# Patient Record
Sex: Female | Born: 1990 | Race: Black or African American | Hispanic: Yes | Marital: Single | Smoking: Current every day smoker
Health system: Southern US, Community
[De-identification: ages and names within clinical notes are randomized; demographics above are authoritative.]

## PROBLEM LIST (undated history)

## (undated) ENCOUNTER — Inpatient Hospital Stay (HOSPITAL_COMMUNITY): Payer: Self-pay

## (undated) DIAGNOSIS — J45909 Unspecified asthma, uncomplicated: Secondary | ICD-10-CM

## (undated) DIAGNOSIS — A599 Trichomoniasis, unspecified: Secondary | ICD-10-CM

## (undated) DIAGNOSIS — S060XAA Concussion with loss of consciousness status unknown, initial encounter: Secondary | ICD-10-CM

## (undated) DIAGNOSIS — R51 Headache: Secondary | ICD-10-CM

## (undated) DIAGNOSIS — M25559 Pain in unspecified hip: Secondary | ICD-10-CM

## (undated) DIAGNOSIS — S060X9A Concussion with loss of consciousness of unspecified duration, initial encounter: Secondary | ICD-10-CM

## (undated) DIAGNOSIS — Q431 Hirschsprung's disease: Secondary | ICD-10-CM

## (undated) DIAGNOSIS — K589 Irritable bowel syndrome without diarrhea: Secondary | ICD-10-CM

## (undated) DIAGNOSIS — F329 Major depressive disorder, single episode, unspecified: Secondary | ICD-10-CM

## (undated) DIAGNOSIS — F419 Anxiety disorder, unspecified: Secondary | ICD-10-CM

## (undated) DIAGNOSIS — Q059 Spina bifida, unspecified: Secondary | ICD-10-CM

## (undated) DIAGNOSIS — G8929 Other chronic pain: Secondary | ICD-10-CM

## (undated) DIAGNOSIS — M549 Dorsalgia, unspecified: Secondary | ICD-10-CM

## (undated) DIAGNOSIS — F32A Depression, unspecified: Secondary | ICD-10-CM

## (undated) HISTORY — PX: MOUTH SURGERY: SHX715

## (undated) HISTORY — PX: TYMPANOSTOMY TUBE PLACEMENT: SHX32

## (undated) HISTORY — PX: BACK SURGERY: SHX140

## (undated) HISTORY — DX: Concussion with loss of consciousness status unknown, initial encounter: S06.0XAA

## (undated) HISTORY — DX: Concussion with loss of consciousness of unspecified duration, initial encounter: S06.0X9A

---
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
## 2010-01-24 ENCOUNTER — Emergency Department (HOSPITAL_COMMUNITY): Admission: EM | Admit: 2010-01-24 | Discharge: 2010-01-25 | Payer: Self-pay | Admitting: Emergency Medicine

## 2010-06-05 ENCOUNTER — Emergency Department (HOSPITAL_COMMUNITY): Admission: EM | Admit: 2010-06-05 | Discharge: 2010-06-06 | Payer: Self-pay | Admitting: Emergency Medicine

## 2010-09-11 ENCOUNTER — Emergency Department (HOSPITAL_COMMUNITY): Admission: EM | Admit: 2010-09-11 | Discharge: 2010-09-11 | Payer: Self-pay | Admitting: Emergency Medicine

## 2010-09-22 ENCOUNTER — Emergency Department (HOSPITAL_COMMUNITY): Admission: EM | Admit: 2010-09-22 | Discharge: 2010-09-22 | Payer: Self-pay | Admitting: Emergency Medicine

## 2011-03-02 ENCOUNTER — Emergency Department (HOSPITAL_COMMUNITY)
Admission: EM | Admit: 2011-03-02 | Discharge: 2011-03-02 | Disposition: A | Discharge status: Home / Self Care | Payer: Self-pay | Attending: Emergency Medicine | Admitting: Emergency Medicine

## 2011-03-02 DIAGNOSIS — Q059 Spina bifida, unspecified: Secondary | ICD-10-CM | POA: Insufficient documentation

## 2011-03-02 DIAGNOSIS — Q431 Hirschsprung's disease: Secondary | ICD-10-CM | POA: Insufficient documentation

## 2011-03-02 DIAGNOSIS — K089 Disorder of teeth and supporting structures, unspecified: Secondary | ICD-10-CM | POA: Insufficient documentation

## 2011-03-02 DIAGNOSIS — Q432 Other congenital functional disorders of colon: Secondary | ICD-10-CM | POA: Insufficient documentation

## 2011-03-02 DIAGNOSIS — K029 Dental caries, unspecified: Secondary | ICD-10-CM | POA: Insufficient documentation

## 2011-03-02 DIAGNOSIS — R070 Pain in throat: Secondary | ICD-10-CM | POA: Insufficient documentation

## 2011-03-12 LAB — RAPID URINE DRUG SCREEN, HOSP PERFORMED
Amphetamines: NOT DETECTED
Benzodiazepines: POSITIVE — AB
Cocaine: NOT DETECTED
Opiates: POSITIVE — AB
Tetrahydrocannabinol: POSITIVE — AB

## 2011-03-12 LAB — URINALYSIS, ROUTINE W REFLEX MICROSCOPIC
Bilirubin Urine: NEGATIVE
Glucose, UA: NEGATIVE mg/dL
Glucose, UA: NEGATIVE mg/dL
Hgb urine dipstick: NEGATIVE
Ketones, ur: NEGATIVE mg/dL
Nitrite: NEGATIVE
Protein, ur: NEGATIVE mg/dL
Protein, ur: NEGATIVE mg/dL
Urobilinogen, UA: 0.2 mg/dL (ref 0.0–1.0)
pH: 5.5 (ref 5.0–8.0)

## 2011-03-12 LAB — DIFFERENTIAL
Lymphocytes Relative: 22 % (ref 12–46)
Lymphs Abs: 1.8 10*3/uL (ref 0.7–4.0)
Monocytes Absolute: 0.6 10*3/uL (ref 0.1–1.0)
Monocytes Relative: 7 % (ref 3–12)
Neutro Abs: 5.8 10*3/uL (ref 1.7–7.7)
Neutrophils Relative %: 69 % (ref 43–77)

## 2011-03-12 LAB — URINE MICROSCOPIC-ADD ON

## 2011-03-12 LAB — CBC
HCT: 38.5 % (ref 36.0–46.0)
Hemoglobin: 13.2 g/dL (ref 12.0–15.0)
MCHC: 34.3 g/dL (ref 30.0–36.0)
RBC: 4.23 MIL/uL (ref 3.87–5.11)

## 2011-03-12 LAB — RAPID STREP SCREEN (MED CTR MEBANE ONLY): Streptococcus, Group A Screen (Direct): NEGATIVE

## 2011-03-12 LAB — BASIC METABOLIC PANEL
GFR calc Af Amer: >60 mL/min (ref >60)
GFR calc non Af Amer: >60 mL/min (ref >60)
Glucose, Bld: 90 mg/dL (ref 70–99)
Potassium: 4 mEq/L (ref 3.5–5.1)
Sodium: 141 mEq/L (ref 135–145)

## 2011-03-12 LAB — TRICYCLICS SCREEN, URINE: TCA Scrn: NOT DETECTED

## 2011-03-12 LAB — POCT PREGNANCY, URINE: Preg Test, Ur: NEGATIVE

## 2011-04-01 LAB — CBC
Hemoglobin: 11.4 g/dL — ABNORMAL LOW (ref 12.0–16.0)
MCHC: 33.5 g/dL (ref 31.0–37.0)
Platelets: 190 10*3/uL (ref 150–400)
RBC: 3.24 MIL/uL — ABNORMAL LOW (ref 3.80–5.70)
RBC: 3.68 MIL/uL — ABNORMAL LOW (ref 3.80–5.70)
RDW: 14.6 % (ref 11.4–15.5)
WBC: 17.7 10*3/uL — ABNORMAL HIGH (ref 4.5–13.5)

## 2011-04-01 LAB — RPR: RPR Ser Ql: NONREACTIVE

## 2011-05-01 ENCOUNTER — Emergency Department (HOSPITAL_COMMUNITY)
Admission: EM | Admit: 2011-05-01 | Discharge: 2011-05-01 | Discharge status: Against Medical Advice | Payer: Self-pay | Attending: Emergency Medicine | Admitting: Emergency Medicine

## 2011-05-01 DIAGNOSIS — M25559 Pain in unspecified hip: Secondary | ICD-10-CM | POA: Insufficient documentation

## 2011-05-01 DIAGNOSIS — R51 Headache: Secondary | ICD-10-CM | POA: Insufficient documentation

## 2011-09-22 LAB — POCT URINALYSIS DIP (DEVICE)
Bilirubin Urine: NEGATIVE
Glucose, UA: NEGATIVE
Nitrite: NEGATIVE
Operator id: 247071
Urobilinogen, UA: 0.2

## 2011-09-22 LAB — RAPID URINE DRUG SCREEN, HOSP PERFORMED
Barbiturates: NOT DETECTED
Cocaine: NOT DETECTED
Opiates: NOT DETECTED
Tetrahydrocannabinol: POSITIVE — AB

## 2011-09-22 LAB — POCT PREGNANCY, URINE: Preg Test, Ur: NEGATIVE

## 2012-11-10 ENCOUNTER — Emergency Department (HOSPITAL_COMMUNITY)
Admission: EM | Admit: 2012-11-10 | Discharge: 2012-11-10 | Disposition: A | Discharge status: Home / Self Care | Payer: Self-pay | Attending: Emergency Medicine | Admitting: Emergency Medicine

## 2012-11-10 ENCOUNTER — Emergency Department (HOSPITAL_COMMUNITY): Payer: Self-pay

## 2012-11-10 ENCOUNTER — Encounter (HOSPITAL_COMMUNITY): Payer: Self-pay | Admitting: *Deleted

## 2012-11-10 DIAGNOSIS — F17200 Nicotine dependence, unspecified, uncomplicated: Secondary | ICD-10-CM | POA: Insufficient documentation

## 2012-11-10 DIAGNOSIS — Y9389 Activity, other specified: Secondary | ICD-10-CM | POA: Insufficient documentation

## 2012-11-10 DIAGNOSIS — IMO0002 Reserved for concepts with insufficient information to code with codable children: Secondary | ICD-10-CM | POA: Insufficient documentation

## 2012-11-10 DIAGNOSIS — S8990XA Unspecified injury of unspecified lower leg, initial encounter: Secondary | ICD-10-CM | POA: Insufficient documentation

## 2012-11-10 DIAGNOSIS — S99929A Unspecified injury of unspecified foot, initial encounter: Secondary | ICD-10-CM | POA: Insufficient documentation

## 2012-11-10 DIAGNOSIS — J45909 Unspecified asthma, uncomplicated: Secondary | ICD-10-CM | POA: Insufficient documentation

## 2012-11-10 DIAGNOSIS — M25562 Pain in left knee: Secondary | ICD-10-CM

## 2012-11-10 DIAGNOSIS — Y9229 Other specified public building as the place of occurrence of the external cause: Secondary | ICD-10-CM | POA: Insufficient documentation

## 2012-11-10 HISTORY — DX: Unspecified asthma, uncomplicated: J45.909

## 2012-11-10 MED ORDER — TRAMADOL HCL 50 MG PO TABS: 50.0000 mg | ORAL_TABLET | Freq: Four times a day (QID) | ORAL | Status: DC | PRN

## 2012-11-10 NOTE — ED Notes (Signed)
Patient given discharge instructions, information, prescriptions, and diet order. Patient states that they adequately understand discharge information given and to return to ED if symptoms return or worsen.     
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
## 2012-11-15 NOTE — ED Provider Notes (Signed)
Medical screening examination/treatment/procedure(s) were performed by non-physician practitioner and as supervising physician I was immediately available for consultation/collaboration.   Fishel Wamble M Birdie Fetty, MD 11/15/12 0819 

## 2012-12-28 NOTE — L&D Delivery Note (Signed)
Delivery Note At 1:57 PM a viable female was delivered via  (Presentation: vtx, ROA ).  APGAR: 9, 9; weight pending.   Placenta status: spontaneous, intact .  Cord:  with the following complications: none  Anesthesia:  Epidural Episiotomy: None Lacerations: None Suture Repair: none Est. Blood Loss (mL):  400  Mom to postpartum.  Baby to Couplet care / Skin to Skin, stable.  Andrea Marshall D 12/14/2013, 2:13 PM

## 2013-04-26 ENCOUNTER — Emergency Department (HOSPITAL_COMMUNITY): Payer: Self-pay

## 2013-04-26 ENCOUNTER — Encounter (HOSPITAL_COMMUNITY): Payer: Self-pay | Admitting: *Deleted

## 2013-04-26 ENCOUNTER — Emergency Department (HOSPITAL_COMMUNITY)
Admission: EM | Admit: 2013-04-26 | Discharge: 2013-04-26 | Disposition: A | Discharge status: Home / Self Care | Payer: Self-pay | Attending: Emergency Medicine | Admitting: Emergency Medicine

## 2013-04-26 DIAGNOSIS — R1084 Generalized abdominal pain: Secondary | ICD-10-CM | POA: Insufficient documentation

## 2013-04-26 DIAGNOSIS — R0602 Shortness of breath: Secondary | ICD-10-CM | POA: Insufficient documentation

## 2013-04-26 DIAGNOSIS — Z3201 Encounter for pregnancy test, result positive: Secondary | ICD-10-CM | POA: Insufficient documentation

## 2013-04-26 DIAGNOSIS — R112 Nausea with vomiting, unspecified: Secondary | ICD-10-CM | POA: Insufficient documentation

## 2013-04-26 DIAGNOSIS — R062 Wheezing: Secondary | ICD-10-CM | POA: Insufficient documentation

## 2013-04-26 DIAGNOSIS — J45909 Unspecified asthma, uncomplicated: Secondary | ICD-10-CM | POA: Insufficient documentation

## 2013-04-26 DIAGNOSIS — Z331 Pregnant state, incidental: Secondary | ICD-10-CM

## 2013-04-26 DIAGNOSIS — N949 Unspecified condition associated with female genital organs and menstrual cycle: Secondary | ICD-10-CM | POA: Insufficient documentation

## 2013-04-26 DIAGNOSIS — R1110 Vomiting, unspecified: Secondary | ICD-10-CM

## 2013-04-26 DIAGNOSIS — R197 Diarrhea, unspecified: Secondary | ICD-10-CM

## 2013-04-26 LAB — CBC WITH DIFFERENTIAL/PLATELET
Basophils Relative: 1 % (ref 0–1)
Eosinophils Absolute: 0.1 10*3/uL (ref 0.0–0.7)
Eosinophils Relative: 1 % (ref 0–5)
Hemoglobin: 12.2 g/dL (ref 12.0–15.0)
Lymphs Abs: 3.1 10*3/uL (ref 0.7–4.0)
MCH: 30.9 pg (ref 26.0–34.0)
MCHC: 34.9 g/dL (ref 30.0–36.0)
MCV: 88.6 fL (ref 78.0–100.0)
Monocytes Relative: 7 % (ref 3–12)
Neutrophils Relative %: 61 % (ref 43–77)
Platelets: 265 10*3/uL (ref 150–400)
RBC: 3.95 MIL/uL (ref 3.87–5.11)

## 2013-04-26 LAB — WET PREP, GENITAL
Trich, Wet Prep: NONE SEEN
Yeast Wet Prep HPF POC: NONE SEEN

## 2013-04-26 LAB — URINE MICROSCOPIC-ADD ON

## 2013-04-26 LAB — URINALYSIS, ROUTINE W REFLEX MICROSCOPIC
Glucose, UA: NEGATIVE mg/dL
Nitrite: NEGATIVE
Specific Gravity, Urine: 1.019 (ref 1.005–1.030)
pH: 7 (ref 5.0–8.0)

## 2013-04-26 LAB — COMPREHENSIVE METABOLIC PANEL
Albumin: 3.8 g/dL (ref 3.5–5.2)
BUN: 5 mg/dL — ABNORMAL LOW (ref 6–23)
Calcium: 9.2 mg/dL (ref 8.4–10.5)
GFR calc Af Amer: >90 mL/min (ref >90)
Glucose, Bld: 91 mg/dL (ref 70–99)
Potassium: 4.2 mEq/L (ref 3.5–5.1)
Total Protein: 7.4 g/dL (ref 6.0–8.3)

## 2013-04-26 LAB — POCT PREGNANCY, URINE: Preg Test, Ur: POSITIVE — AB

## 2013-04-26 LAB — LIPASE, BLOOD: Lipase: 23 U/L (ref 11–59)

## 2013-04-26 MED ORDER — ONDANSETRON HCL 4 MG/2ML IJ SOLN
4.0000 mg | Freq: Once | INTRAMUSCULAR | Status: AC
  Administered 2013-04-26: 4 mg via INTRAVENOUS
  Filled 2013-04-26: qty 2

## 2013-04-26 MED ORDER — ONDANSETRON HCL 4 MG PO TABS: 4.0000 mg | ORAL_TABLET | Freq: Four times a day (QID) | ORAL | Status: DC

## 2013-04-26 MED ORDER — SODIUM CHLORIDE 0.9 % IV BOLUS (SEPSIS)
1000.0000 mL | Freq: Once | INTRAVENOUS | Status: AC
  Administered 2013-04-26: 1000 mL via INTRAVENOUS

## 2013-04-26 NOTE — ED Notes (Signed)
Multiple complaints. Reports having asthma, sob and is out of her inhaler. Having back pain, headache and pelvic pain.

## 2013-04-26 NOTE — ED Provider Notes (Signed)
History     CSN: [id number]  Arrival date & time 04/26/13  1501   First MD Initiated Contact with Patient 04/26/13 1815      Chief Complaint  Patient presents with  . Headache  . Asthma    (Consider location/radiation/quality/duration/timing/severity/associated sxs/prior treatment) HPI Comments: This presents to the ER for evaluation of headache, increased wheezing secondary to asthma, nausea, vomiting and diarrhea. Patient has been experiencing intermittent mid abdominal cramping. She has not had a fever. Nausea and vomiting has been recurrent over the course of the day. She's having trouble eating and drinking. Pain is intermittent and crampy in the abdomen. She has a moderate constant throbbing headache.  Patient reports that her period has been irregular since she started birth control. She had some slight spotting in the last week or 2, but has not had a regular period in a couple of months.  Patient is a 22 y.o. female presenting with headaches and asthma.  Headache Associated symptoms: abdominal pain, diarrhea, nausea and vomiting   Asthma Associated symptoms include abdominal pain, headaches and shortness of breath.    Past Medical History  Diagnosis Date  . Asthma     History reviewed. No pertinent past surgical history.  History reviewed. No pertinent family history.  History  Substance Use Topics  . Smoking status: Current Every Day Smoker  . Smokeless tobacco: Not on file  . Alcohol Use: No    OB History   Grav Para Term Preterm Abortions TAB SAB Ect Mult Living                  Review of Systems  Respiratory: Positive for shortness of breath and wheezing.   Gastrointestinal: Positive for nausea, vomiting, abdominal pain and diarrhea.  Genitourinary: Positive for pelvic pain.  Neurological: Positive for headaches.  All other systems reviewed and are negative.    Allergies  Aspirin; Penicillins; and Tylenol  Home Medications   Current  Outpatient Rx  Name  Route  Sig  Dispense  Refill  . DM-Doxylamine-Acetaminophen (NYQUIL COLD & FLU PO)   Oral   Take 2 capsules by mouth at bedtime as needed (FOR COLD).           BP 112/63  Pulse 79  Temp(Src) 98.4 F (36.9 C) (Oral)  Resp 16  SpO2 100%  LMP 01/28/2013  Physical Exam  Constitutional: She is oriented to person, place, and time. She appears well-developed and well-nourished. No distress.  HENT:  Head: Normocephalic and atraumatic.  Right Ear: Hearing normal.  Nose: Nose normal.  Mouth/Throat: Oropharynx is clear and moist and mucous membranes are normal.  Eyes: Conjunctivae and EOM are normal. Pupils are equal, round, and reactive to light.  Neck: Normal range of motion. Neck supple.  Cardiovascular: Normal rate, regular rhythm, S1 normal and S2 normal.  Exam reveals no gallop and no friction rub.   No murmur heard. Pulmonary/Chest: Effort normal and breath sounds normal. No respiratory distress. She exhibits no tenderness.  Abdominal: Soft. Normal appearance and bowel sounds are normal. There is no hepatosplenomegaly. There is generalized tenderness. There is no rebound, no guarding, no tenderness at McBurney's point and negative Murphy's sign. No hernia.  Genitourinary: Vagina normal and uterus normal. There is no tenderness on the right labia. There is no tenderness on the left labia. Cervix exhibits no motion tenderness and no discharge. Right adnexum displays no mass and no tenderness. Left adnexum displays no mass and no tenderness.  Musculoskeletal: Normal range of  motion.  Neurological: She is alert and oriented to person, place, and time. She has normal strength. No cranial nerve deficit or sensory deficit. Coordination normal. GCS eye subscore is 4. GCS verbal subscore is 5. GCS motor subscore is 6.  Skin: Skin is warm, dry and intact. No rash noted. No cyanosis.  Psychiatric: She has a normal mood and affect. Her speech is normal and behavior is normal.  Thought content normal.    ED Course  Procedures (including critical care time)  Labs Reviewed  URINALYSIS, ROUTINE W REFLEX MICROSCOPIC - Abnormal; Notable for the following:    APPearance CLOUDY (*)    Leukocytes, UA TRACE (*)    All other components within normal limits  URINE MICROSCOPIC-ADD ON - Abnormal; Notable for the following:    Squamous Epithelial / LPF MANY (*)    Bacteria, UA FEW (*)    All other components within normal limits  POCT PREGNANCY, URINE - Abnormal; Notable for the following:    Preg Test, Ur POSITIVE (*)    All other components within normal limits  CBC WITH DIFFERENTIAL  COMPREHENSIVE METABOLIC PANEL  LIPASE, BLOOD   US Ob Comp Less 14 Wks  04/26/2013  *RADIOLOGY REPORT*  Clinical Data: Pelvic pain.  Positive pregnancy test.  OBSTETRIC <14 WK [place] AND TRANSVAGINAL OB US  Technique:  Both transabdominal and transvaginal ultrasound examinations were performed for complete evaluation of the gestation as well as the maternal uterus, adnexal regions, and pelvic cul-de-sac.  Transvaginal technique was performed to assess early pregnancy.  Comparison:  None.  Intrauterine gestational sac:  Visualized Yolk sac: Visualized Embryo: Visualized Cardiac Activity: Visualized Heart Rate: 126  bpm  CRL: 6.8  mm  6 w  4 d        [place] EDC: 12/16/2013  Maternal uterus/adnexae: Moderate subchorionic hemorrhage is evident.  The maternal ovaries are unremarkable.  No intraperitoneal free fluid.  IMPRESSION: Single living intrauterine gestation at estimated 6-week-4-day distention only age by crown-rump length.  There is associated moderate subchorionic hemorrhage.   Original Report Authenticated By: Kennith Center, M.D.    US Ob Transvaginal  04/26/2013  *RADIOLOGY REPORT*  Clinical Data: Pelvic pain.  Positive pregnancy test.  OBSTETRIC <14 WK [place] AND TRANSVAGINAL OB US  Technique:  Both transabdominal and transvaginal ultrasound examinations were performed for complete evaluation of the  gestation as well as the maternal uterus, adnexal regions, and pelvic cul-de-sac.  Transvaginal technique was performed to assess early pregnancy.  Comparison:  None.  Intrauterine gestational sac:  Visualized Yolk sac: Visualized Embryo: Visualized Cardiac Activity: Visualized Heart Rate: 126  bpm  CRL: 6.8  mm  6 w  4 d        [place] EDC: 12/16/2013  Maternal uterus/adnexae: Moderate subchorionic hemorrhage is evident.  The maternal ovaries are unremarkable.  No intraperitoneal free fluid.  IMPRESSION: Single living intrauterine gestation at estimated 6-week-4-day distention only age by crown-rump length.  There is associated moderate subchorionic hemorrhage.   Original Report Authenticated By: Kennith Center, M.D.      Diagnosis: 1. Intrauterine pregnancy 2. Vomiting and diarrhea 3. History of Asthma    MDM  Patient comes to the ER for evaluation of multiple complaints. Patient has had nausea, vomiting and diarrhea. She was found to be pregnant upon initial urine pregnancy. This explains the majority of the patient's symptoms. She reports that she had some spotting earlier in the week. Pelvic exam was unremarkable. Ultrasound revealed intrauterine pregnancy.  Patient reports asthma, but  she has clear lungs without any bronchospasm currently. No evidence of ongoing asthma exacerbation at this time.  Patient was hydrated and is doing well. Her symptoms have resolved. She will be discharged with Zofran. She has an OB/GYN to followup with.        Gilda Crease, MD 04/27/13 1113
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
## 2013-04-27 LAB — GC/CHLAMYDIA PROBE AMP
CT Probe RNA: NEGATIVE
GC Probe RNA: NEGATIVE

## 2013-05-08 ENCOUNTER — Inpatient Hospital Stay (HOSPITAL_COMMUNITY)
Admission: AD | Admit: 2013-05-08 | Discharge: 2013-05-08 | Disposition: A | Discharge status: Home / Self Care | Payer: Medicaid Other | Source: Ambulatory Visit | Attending: Obstetrics and Gynecology | Admitting: Obstetrics and Gynecology

## 2013-05-08 ENCOUNTER — Encounter (HOSPITAL_COMMUNITY): Payer: Self-pay | Admitting: *Deleted

## 2013-05-08 DIAGNOSIS — O210 Mild hyperemesis gravidarum: Secondary | ICD-10-CM | POA: Insufficient documentation

## 2013-05-08 DIAGNOSIS — O219 Vomiting of pregnancy, unspecified: Secondary | ICD-10-CM

## 2013-05-08 HISTORY — DX: Depression, unspecified: F32.A

## 2013-05-08 HISTORY — DX: Pain in unspecified hip: M25.559

## 2013-05-08 HISTORY — DX: Headache: R51

## 2013-05-08 HISTORY — DX: Irritable bowel syndrome, unspecified: K58.9

## 2013-05-08 HISTORY — DX: Hirschsprung's disease: Q43.1

## 2013-05-08 HISTORY — DX: Major depressive disorder, single episode, unspecified: F32.9

## 2013-05-08 HISTORY — DX: Anxiety disorder, unspecified: F41.9

## 2013-05-08 HISTORY — DX: Trichomoniasis, unspecified: A59.9

## 2013-05-08 HISTORY — DX: Dorsalgia, unspecified: M54.9

## 2013-05-08 HISTORY — DX: Other chronic pain: G89.29

## 2013-05-08 HISTORY — DX: Spina bifida, unspecified: Q05.9

## 2013-05-08 LAB — URINALYSIS, ROUTINE W REFLEX MICROSCOPIC
Bilirubin Urine: NEGATIVE
Glucose, UA: NEGATIVE mg/dL
Hgb urine dipstick: NEGATIVE
Specific Gravity, Urine: 1.02 (ref 1.005–1.030)
Urobilinogen, UA: 0.2 mg/dL (ref 0.0–1.0)
pH: 7.5 (ref 5.0–8.0)

## 2013-05-08 MED ORDER — PROMETHAZINE HCL 25 MG PO TABS: 25.0000 mg | ORAL_TABLET | Freq: Four times a day (QID) | ORAL | Status: DC | PRN

## 2013-05-08 NOTE — MAU Note (Addendum)
Seen 4/30 @ [facility]. Was having some spotting.Had labs and cultures, U/S. States was not told results of U/S. States she has Zofran at home. Sometimes it works and sometimes not. States sometimes she vomits 1-2 times per day, sometimes not at all. States spotting had stopped, but noted some yesterday. Came in today because she is still getting sick, spotting and wants to make sure everything is OK. Plans to go to Dr. Jackelyn Knife where she went with last pregnancy. Was seen in office 4-5 months ago. Was getting Depo, but did not get the last scheduled one. Will change patient papers and armband to that group.
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
## 2013-05-08 NOTE — MAU Note (Signed)
Discussed results from 04/26/2013 with pt. Informed her of her due date and # weeks pregnant. Patient states she feels better about everything now. Encouragement given regarding nausea.

## 2013-05-10 NOTE — MAU Provider Note (Signed)
Attestation of Attending Supervision of Advanced Practitioner (PA/CNM/NP): Evaluation and management procedures were performed by the Advanced Practitioner under my supervision and collaboration.  I have reviewed the Advanced Practitioner's note and chart, and I agree with the management and plan.  Rivkah Wolz, MD, FACOG Attending Obstetrician & Gynecologist Faculty Practice, [facility]  

## 2013-06-27 LAB — OB RESULTS CONSOLE RPR: RPR: NONREACTIVE

## 2013-06-27 LAB — OB RESULTS CONSOLE ABO/RH

## 2013-06-27 LAB — OB RESULTS CONSOLE GC/CHLAMYDIA
Chlamydia: NEGATIVE
Gonorrhea: NEGATIVE

## 2013-06-27 LAB — OB RESULTS CONSOLE HIV ANTIBODY (ROUTINE TESTING): HIV: NONREACTIVE

## 2013-11-20 LAB — OB RESULTS CONSOLE GC/CHLAMYDIA
Chlamydia: NEGATIVE
Gonorrhea: NEGATIVE

## 2013-11-20 LAB — OB RESULTS CONSOLE GBS: GBS: NEGATIVE

## 2013-12-08 ENCOUNTER — Telehealth (HOSPITAL_COMMUNITY): Payer: Self-pay | Admitting: *Deleted

## 2013-12-08 ENCOUNTER — Encounter (HOSPITAL_COMMUNITY): Payer: Self-pay | Admitting: *Deleted

## 2013-12-08 NOTE — Telephone Encounter (Signed)
Preadmission screen  

## 2013-12-14 ENCOUNTER — Encounter (HOSPITAL_COMMUNITY): Payer: Medicaid Other | Admitting: Anesthesiology

## 2013-12-14 ENCOUNTER — Encounter (HOSPITAL_COMMUNITY): Payer: Self-pay

## 2013-12-14 ENCOUNTER — Inpatient Hospital Stay (HOSPITAL_COMMUNITY)
Admission: RE | Admit: 2013-12-14 | Discharge: 2013-12-16 | DRG: 775 | Disposition: A | Discharge status: Home / Self Care | Payer: Medicaid Other | Source: Ambulatory Visit | Attending: Obstetrics and Gynecology | Admitting: Obstetrics and Gynecology

## 2013-12-14 ENCOUNTER — Inpatient Hospital Stay (HOSPITAL_COMMUNITY): Payer: Medicaid Other | Admitting: Anesthesiology

## 2013-12-14 DIAGNOSIS — Z3490 Encounter for supervision of normal pregnancy, unspecified, unspecified trimester: Secondary | ICD-10-CM

## 2013-12-14 DIAGNOSIS — O99334 Smoking (tobacco) complicating childbirth: Principal | ICD-10-CM | POA: Diagnosis present

## 2013-12-14 LAB — CBC
MCH: 29.9 pg (ref 26.0–34.0)
MCHC: 33.3 g/dL (ref 30.0–36.0)
MCV: 89.6 fL (ref 78.0–100.0)
Platelets: 157 10*3/uL (ref 150–400)
RDW: 15.3 % (ref 11.5–15.5)

## 2013-12-14 LAB — RPR: RPR Ser Ql: NONREACTIVE

## 2013-12-14 MED ORDER — LACTATED RINGERS IV SOLN: 500.0000 mL | INTRAVENOUS | Status: DC | PRN

## 2013-12-14 MED ORDER — OXYTOCIN BOLUS FROM INFUSION: 500.0000 mL | INTRAVENOUS | Status: DC

## 2013-12-14 MED ORDER — SIMETHICONE 80 MG PO CHEW: 80.0000 mg | CHEWABLE_TABLET | ORAL | Status: DC | PRN

## 2013-12-14 MED ORDER — METHYLERGONOVINE MALEATE 0.2 MG PO TABS: 0.2000 mg | ORAL_TABLET | ORAL | Status: DC | PRN

## 2013-12-14 MED ORDER — LIDOCAINE HCL (PF) 1 % IJ SOLN
30.0000 mL | INTRAMUSCULAR | Status: DC | PRN
  Filled 2013-12-14: qty 30

## 2013-12-14 MED ORDER — TERBUTALINE SULFATE 1 MG/ML IJ SOLN: 0.2500 mg | Freq: Once | INTRAMUSCULAR | Status: DC | PRN

## 2013-12-14 MED ORDER — SENNOSIDES-DOCUSATE SODIUM 8.6-50 MG PO TABS
2.0000 | ORAL_TABLET | ORAL | Status: DC
  Administered 2013-12-15: 2 via ORAL
  Filled 2013-12-14: qty 2

## 2013-12-14 MED ORDER — DIPHENHYDRAMINE HCL 25 MG PO CAPS: 25.0000 mg | ORAL_CAPSULE | Freq: Four times a day (QID) | ORAL | Status: DC | PRN

## 2013-12-14 MED ORDER — WITCH HAZEL-GLYCERIN EX PADS: 1.0000 "application " | MEDICATED_PAD | CUTANEOUS | Status: DC | PRN

## 2013-12-14 MED ORDER — BENZOCAINE-MENTHOL 20-0.5 % EX AERO
1.0000 "application " | INHALATION_SPRAY | CUTANEOUS | Status: DC | PRN
  Administered 2013-12-14: 1 via TOPICAL
  Filled 2013-12-14: qty 56

## 2013-12-14 MED ORDER — OXYTOCIN 40 UNITS IN LACTATED RINGERS INFUSION - SIMPLE MED
1.0000 m[IU]/min | INTRAVENOUS | Status: DC
  Administered 2013-12-14: 2 m[IU]/min via INTRAVENOUS
  Administered 2013-12-14: 6 m[IU]/min via INTRAVENOUS
  Filled 2013-12-14: qty 1000

## 2013-12-14 MED ORDER — METHYLERGONOVINE MALEATE 0.2 MG/ML IJ SOLN: 0.2000 mg | INTRAMUSCULAR | Status: DC | PRN

## 2013-12-14 MED ORDER — LACTATED RINGERS IV SOLN: 500.0000 mL | Freq: Once | INTRAVENOUS | Status: DC

## 2013-12-14 MED ORDER — OXYCODONE-ACETAMINOPHEN 5-325 MG PO TABS: 1.0000 | ORAL_TABLET | ORAL | Status: DC | PRN

## 2013-12-14 MED ORDER — LACTATED RINGERS IV SOLN
INTRAVENOUS | Status: DC
  Administered 2013-12-14: 09:00:00 via INTRAVENOUS

## 2013-12-14 MED ORDER — ACETAMINOPHEN 325 MG PO TABS: 650.0000 mg | ORAL_TABLET | ORAL | Status: DC | PRN

## 2013-12-14 MED ORDER — SODIUM BICARBONATE 8.4 % IV SOLN
INTRAVENOUS | Status: DC | PRN
  Administered 2013-12-14: 5 mL via EPIDURAL

## 2013-12-14 MED ORDER — MAGNESIUM HYDROXIDE 400 MG/5ML PO SUSP: 30.0000 mL | ORAL | Status: DC | PRN

## 2013-12-14 MED ORDER — DIBUCAINE 1 % RE OINT: 1.0000 "application " | TOPICAL_OINTMENT | RECTAL | Status: DC | PRN

## 2013-12-14 MED ORDER — PHENYLEPHRINE 40 MCG/ML (10ML) SYRINGE FOR IV PUSH (FOR BLOOD PRESSURE SUPPORT)
80.0000 ug | PREFILLED_SYRINGE | INTRAVENOUS | Status: DC | PRN
  Filled 2013-12-14: qty 2

## 2013-12-14 MED ORDER — ONDANSETRON HCL 4 MG/2ML IJ SOLN: 4.0000 mg | Freq: Four times a day (QID) | INTRAMUSCULAR | Status: DC | PRN

## 2013-12-14 MED ORDER — ZOLPIDEM TARTRATE 5 MG PO TABS: 5.0000 mg | ORAL_TABLET | Freq: Every evening | ORAL | Status: DC | PRN

## 2013-12-14 MED ORDER — EPHEDRINE 5 MG/ML INJ
10.0000 mg | INTRAVENOUS | Status: DC | PRN
  Filled 2013-12-14: qty 2

## 2013-12-14 MED ORDER — EPHEDRINE 5 MG/ML INJ
10.0000 mg | INTRAVENOUS | Status: DC | PRN
  Filled 2013-12-14: qty 4
  Filled 2013-12-14: qty 2

## 2013-12-14 MED ORDER — LANOLIN HYDROUS EX OINT: TOPICAL_OINTMENT | CUTANEOUS | Status: DC | PRN

## 2013-12-14 MED ORDER — ALBUTEROL SULFATE HFA 108 (90 BASE) MCG/ACT IN AERS: 2.0000 | INHALATION_SPRAY | Freq: Four times a day (QID) | RESPIRATORY_TRACT | Status: DC | PRN

## 2013-12-14 MED ORDER — ONDANSETRON HCL 4 MG/2ML IJ SOLN: 4.0000 mg | INTRAMUSCULAR | Status: DC | PRN

## 2013-12-14 MED ORDER — FENTANYL 2.5 MCG/ML BUPIVACAINE 1/10 % EPIDURAL INFUSION (WH - ANES)
14.0000 mL/h | INTRAMUSCULAR | Status: DC | PRN
  Administered 2013-12-14: 14 mL/h via EPIDURAL
  Filled 2013-12-14: qty 125

## 2013-12-14 MED ORDER — CITRIC ACID-SODIUM CITRATE 334-500 MG/5ML PO SOLN: 30.0000 mL | ORAL | Status: DC | PRN

## 2013-12-14 MED ORDER — MEASLES, MUMPS & RUBELLA VAC ~~LOC~~ INJ: 0.5000 mL | INJECTION | Freq: Once | SUBCUTANEOUS | Status: DC

## 2013-12-14 MED ORDER — DIPHENHYDRAMINE HCL 50 MG/ML IJ SOLN: 12.5000 mg | INTRAMUSCULAR | Status: DC | PRN

## 2013-12-14 MED ORDER — ONDANSETRON HCL 4 MG PO TABS: 4.0000 mg | ORAL_TABLET | ORAL | Status: DC | PRN

## 2013-12-14 MED ORDER — OXYTOCIN 40 UNITS IN LACTATED RINGERS INFUSION - SIMPLE MED: 62.5000 mL/h | INTRAVENOUS | Status: DC

## 2013-12-14 MED ORDER — OXYCODONE-ACETAMINOPHEN 5-325 MG PO TABS
1.0000 | ORAL_TABLET | ORAL | Status: DC | PRN
  Administered 2013-12-14: 1 via ORAL
  Administered 2013-12-14 – 2013-12-16 (×9): 2 via ORAL
  Filled 2013-12-14 (×3): qty 2
  Filled 2013-12-14: qty 1
  Filled 2013-12-14 (×6): qty 2

## 2013-12-14 MED ORDER — PRENATAL MULTIVITAMIN CH
1.0000 | ORAL_TABLET | Freq: Every day | ORAL | Status: DC
  Filled 2013-12-14 (×2): qty 1

## 2013-12-14 MED ORDER — PHENYLEPHRINE 40 MCG/ML (10ML) SYRINGE FOR IV PUSH (FOR BLOOD PRESSURE SUPPORT)
80.0000 ug | PREFILLED_SYRINGE | INTRAVENOUS | Status: DC | PRN
  Filled 2013-12-14: qty 2
  Filled 2013-12-14: qty 10

## 2013-12-14 MED ORDER — BUTORPHANOL TARTRATE 1 MG/ML IJ SOLN: 1.0000 mg | INTRAMUSCULAR | Status: DC | PRN

## 2013-12-14 MED ORDER — TETANUS-DIPHTH-ACELL PERTUSSIS 5-2.5-18.5 LF-MCG/0.5 IM SUSP: 0.5000 mL | Freq: Once | INTRAMUSCULAR | Status: DC
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
## 2013-12-14 NOTE — Anesthesia Procedure Notes (Signed)

## 2013-12-14 NOTE — Progress Notes (Signed)
Comfortable with epidural Afeb, VSS FHT- Cat I, ctx q 3 min VE-6/80/-1, vtx Continue pitocin and monitor progress

## 2013-12-14 NOTE — Anesthesia Preprocedure Evaluation (Signed)
Anesthesia Evaluation    Airway       Dental   Pulmonary Current Smoker,          Cardiovascular     Neuro/Psych    GI/Hepatic   Endo/Other    Renal/GU      Musculoskeletal   Abdominal   Peds  Hematology   Anesthesia Other Findings Had spinal surgery as infant, repaired spina bifida, received epidural with previous delivery @[facility]   Reproductive/Obstetrics                           Anesthesia Physical Anesthesia Plan  ASA: II  Anesthesia Plan: Epidural   Post-op Pain Management:    Induction:   Airway Management Planned:   Additional Equipment:   Intra-op Plan:   Post-operative Plan:   Informed Consent: I have reviewed the patients History and Physical, chart, labs and discussed the procedure including the risks, benefits and alternatives for the proposed anesthesia with the patient or authorized representative who has indicated his/her understanding and acceptance.   Dental Advisory Given  Plan Discussed with:   Anesthesia Plan Comments: (Labs checked- platelets confirmed with RN in room. Fetal heart tracing, per RN, reported to be stable enough for sitting procedure. Discussed epidural, and patient consents to the procedure:  included risk of possible headache,backache, failed block, allergic reaction, and nerve injury. This patient was asked if she had any questions or concerns before the procedure started. )        Anesthesia Quick Evaluation

## 2013-12-15 ENCOUNTER — Encounter (HOSPITAL_COMMUNITY): Payer: Self-pay

## 2013-12-15 MED ORDER — FLUOXETINE HCL 20 MG PO CAPS
20.0000 mg | ORAL_CAPSULE | Freq: Every day | ORAL | Status: DC
  Administered 2013-12-15 – 2013-12-16 (×2): 20 mg via ORAL
  Filled 2013-12-15 (×2): qty 1

## 2013-12-15 NOTE — Progress Notes (Signed)
UR chart review completed.  

## 2013-12-15 NOTE — Anesthesia Postprocedure Evaluation (Signed)
Anesthesia Post Note  Patient: Andrea Marshall  Procedure(s) Performed: * No procedures listed *  Anesthesia type: Epidural  Patient location: Mother/Baby  Post pain: Pain level controlled  Post assessment: Post-op Vital signs reviewed  Last Vitals:  Filed Vitals:   12/15/13 0516  BP: 111/72  Pulse: 94  Temp: 36.6 C  Resp: 18    Post vital signs: Reviewed  Level of consciousness: awake  Complications: No apparent anesthesia complications

## 2013-12-15 NOTE — Progress Notes (Signed)
Patient ID: Andrea Marshall, female   DOB: [birth year], 22 y.o.   MRN: [id number]  Pt worries about mood.  Feels like struggling.  Requests Xanax, d/w pt that that is more of an episodic medicine.  D/W pt SSRI - will try prozac.

## 2013-12-15 NOTE — Progress Notes (Signed)
PPD #1 No problems Afeb, VSS Fundus firm, NT at U-1 Continue routine postpartum care 

## 2013-12-16 MED ORDER — CVS PRENATAL GUMMY 0.4-113.5 MG PO CHEW: 1.0000 | CHEWABLE_TABLET | Freq: Every day | ORAL | Status: DC

## 2013-12-16 MED ORDER — FLUOXETINE HCL 20 MG PO CAPS: 20.0000 mg | ORAL_CAPSULE | Freq: Every day | ORAL | Status: DC

## 2013-12-16 MED ORDER — OXYCODONE-ACETAMINOPHEN 5-325 MG PO TABS
1.0000 | ORAL_TABLET | Freq: Four times a day (QID) | ORAL | Status: DC | PRN
Start: 2013-12-16 — End: 2015-04-27

## 2013-12-16 NOTE — Lactation Note (Signed)
This note was copied from the chart of Boy Sherica Paternostro. Lactation Consultation Note: Baby fussy and sucking on his hands when I went into room. Mom reports that he has just fed for 20 minutes. Discussed feeding cues and encouraged to latch him again. Assisted mom with latch.  Mom complaining of cramping- RN notified. Mom reports that she has been using comfort gels and they are helping. No questions at present. To call prn.  Patient Name: Boy Ninette Cotta [id number] Date: 12/16/2013 Reason for consult: Follow-up assessment   Maternal Data Formula Feeding for Exclusion: Yes Reason for exclusion: Mother's choice to formula and breast feed on admission  Feeding Feeding Type: Breast Fed  LATCH Score/Interventions Latch: Grasps breast easily, tongue down, lips flanged, rhythmical sucking.  Audible Swallowing: A few with stimulation  Type of Nipple: Everted at rest and after stimulation  Comfort (Breast/Nipple): Soft / non-tender     Hold (Positioning): Assistance needed to correctly position infant at breast and maintain latch. Intervention(s): Support Pillows  LATCH Score: 8  Lactation Tools Discussed/Used     Consult Status Consult Status: Complete    Pamelia Hoit 12/16/2013, 10:07 AM

## 2013-12-16 NOTE — Discharge Summary (Signed)
Obstetric Discharge Summary Reason for Admission: onset of labor Prenatal Procedures: none Intrapartum Procedures: spontaneous vaginal delivery Postpartum Procedures: none Complications-Operative and Postpartum: none Hemoglobin  Date Value Range Status  12/14/2013 10.1* 12.0 - 15.0 g/dL Final     HCT  Date Value Range Status  12/14/2013 30.3* 36.0 - 46.0 % Final    Physical Exam:  General: alert and no distress Lochia: appropriate Uterine Fundus: firm  Discharge Diagnoses: Term Pregnancy-delivered  Discharge Information: Date: 12/16/2013 Activity: pelvic rest Diet: routine Medications: PNV, Colace, Percocet and Prozac Condition: stable Instructions: refer to practice specific booklet Discharge to: home Follow-up Information   Follow up with MEISINGER,TODD D, MD. Schedule an appointment as soon as possible for a visit in 6 weeks.   Specialty:  Obstetrics and Gynecology   Contact information:   [address] [place] [address] [phone]       Newborn Data: Live born female  Birth Weight: 6 lb 3 oz (2807 g) APGAR: 9, 9  Home with mother  Added prozac for mood control per pt request.  .  Marshall,Andrea Gladman 12/16/2013, 8:06 AM

## 2013-12-16 NOTE — Progress Notes (Addendum)
Post Partum Day 2 Subjective: no complaints, up ad lib, voiding, tolerating PO and nl lochia, pain controlled  Objective: Blood pressure 108/71, pulse 89, temperature 97.7 F (36.5 C), temperature source Oral, resp. rate 18, height 5\' 6"  (1.676 m), weight 67.132 kg (148 lb), last menstrual period 03/24/2013, SpO2 99.00%, unknown if currently breastfeeding.  Physical Exam:  General: alert and no distress Lochia: appropriate Uterine Fundus: firm    Recent Labs  12/14/13 0907  HGB 10.1*  HCT 30.3*    Assessment/Plan: Discharge home, Breastfeeding and Lactation consult.  D/c with motrin/percocet/pnv.  F/u 6 weeks  Pt desires d/c to home this PM - will monitor BP until afternoon   LOS: 2 days   BOVARD,Angeles Paolucci 12/16/2013, 7:30 AM

## 2014-10-29 ENCOUNTER — Encounter (HOSPITAL_COMMUNITY): Payer: Self-pay

## 2015-04-24 ENCOUNTER — Encounter (HOSPITAL_COMMUNITY): Payer: Self-pay | Admitting: Emergency Medicine

## 2015-04-24 ENCOUNTER — Emergency Department (HOSPITAL_COMMUNITY)
Admission: EM | Admit: 2015-04-24 | Discharge: 2015-04-24 | Discharge status: Against Medical Advice | Payer: Medicaid Other | Attending: Emergency Medicine | Admitting: Emergency Medicine

## 2015-04-24 ENCOUNTER — Emergency Department (HOSPITAL_COMMUNITY): Payer: Medicaid Other

## 2015-04-24 DIAGNOSIS — Z8719 Personal history of other diseases of the digestive system: Secondary | ICD-10-CM | POA: Diagnosis not present

## 2015-04-24 DIAGNOSIS — T148 Other injury of unspecified body region: Secondary | ICD-10-CM | POA: Diagnosis not present

## 2015-04-24 DIAGNOSIS — Z8619 Personal history of other infectious and parasitic diseases: Secondary | ICD-10-CM | POA: Diagnosis not present

## 2015-04-24 DIAGNOSIS — Q431 Hirschsprung's disease: Secondary | ICD-10-CM | POA: Insufficient documentation

## 2015-04-24 DIAGNOSIS — Q059 Spina bifida, unspecified: Secondary | ICD-10-CM | POA: Diagnosis not present

## 2015-04-24 DIAGNOSIS — Z79899 Other long term (current) drug therapy: Secondary | ICD-10-CM | POA: Insufficient documentation

## 2015-04-24 DIAGNOSIS — Z720 Tobacco use: Secondary | ICD-10-CM | POA: Insufficient documentation

## 2015-04-24 DIAGNOSIS — F329 Major depressive disorder, single episode, unspecified: Secondary | ICD-10-CM | POA: Diagnosis not present

## 2015-04-24 DIAGNOSIS — S0990XA Unspecified injury of head, initial encounter: Secondary | ICD-10-CM | POA: Diagnosis present

## 2015-04-24 DIAGNOSIS — Y9289 Other specified places as the place of occurrence of the external cause: Secondary | ICD-10-CM | POA: Diagnosis not present

## 2015-04-24 DIAGNOSIS — Y998 Other external cause status: Secondary | ICD-10-CM | POA: Diagnosis not present

## 2015-04-24 DIAGNOSIS — Y9389 Activity, other specified: Secondary | ICD-10-CM | POA: Insufficient documentation

## 2015-04-24 DIAGNOSIS — R0789 Other chest pain: Secondary | ICD-10-CM

## 2015-04-24 DIAGNOSIS — J45909 Unspecified asthma, uncomplicated: Secondary | ICD-10-CM | POA: Diagnosis not present

## 2015-04-24 DIAGNOSIS — S060X0A Concussion without loss of consciousness, initial encounter: Secondary | ICD-10-CM | POA: Diagnosis not present

## 2015-04-24 DIAGNOSIS — S29001A Unspecified injury of muscle and tendon of front wall of thorax, initial encounter: Secondary | ICD-10-CM | POA: Diagnosis not present

## 2015-04-24 DIAGNOSIS — F419 Anxiety disorder, unspecified: Secondary | ICD-10-CM | POA: Insufficient documentation

## 2015-04-24 DIAGNOSIS — G8929 Other chronic pain: Secondary | ICD-10-CM | POA: Diagnosis not present

## 2015-04-24 DIAGNOSIS — T07XXXA Unspecified multiple injuries, initial encounter: Secondary | ICD-10-CM

## 2015-04-24 MED ORDER — ACETAMINOPHEN 500 MG PO TABS: 1000.0000 mg | ORAL_TABLET | Freq: Once | ORAL | Status: DC
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
## 2015-04-24 NOTE — ED Notes (Signed)
Pt refuses to stay, pt reports she is leaving.
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
## 2015-04-27 ENCOUNTER — Emergency Department (HOSPITAL_COMMUNITY)
Admission: EM | Admit: 2015-04-27 | Discharge: 2015-04-27 | Disposition: A | Discharge status: Home / Self Care | Payer: Medicaid Other | Attending: Emergency Medicine | Admitting: Emergency Medicine

## 2015-04-27 ENCOUNTER — Encounter (HOSPITAL_COMMUNITY): Payer: Self-pay

## 2015-04-27 DIAGNOSIS — J45909 Unspecified asthma, uncomplicated: Secondary | ICD-10-CM | POA: Insufficient documentation

## 2015-04-27 DIAGNOSIS — Z880 Allergy status to penicillin: Secondary | ICD-10-CM | POA: Insufficient documentation

## 2015-04-27 DIAGNOSIS — R51 Headache: Secondary | ICD-10-CM | POA: Diagnosis not present

## 2015-04-27 DIAGNOSIS — R110 Nausea: Secondary | ICD-10-CM | POA: Insufficient documentation

## 2015-04-27 DIAGNOSIS — Z8659 Personal history of other mental and behavioral disorders: Secondary | ICD-10-CM | POA: Diagnosis not present

## 2015-04-27 DIAGNOSIS — Q059 Spina bifida, unspecified: Secondary | ICD-10-CM | POA: Diagnosis not present

## 2015-04-27 DIAGNOSIS — R519 Headache, unspecified: Secondary | ICD-10-CM

## 2015-04-27 DIAGNOSIS — Z8719 Personal history of other diseases of the digestive system: Secondary | ICD-10-CM | POA: Diagnosis not present

## 2015-04-27 DIAGNOSIS — Z720 Tobacco use: Secondary | ICD-10-CM | POA: Insufficient documentation

## 2015-04-27 DIAGNOSIS — Q431 Hirschsprung's disease: Secondary | ICD-10-CM | POA: Insufficient documentation

## 2015-04-27 DIAGNOSIS — Z8619 Personal history of other infectious and parasitic diseases: Secondary | ICD-10-CM | POA: Diagnosis not present

## 2015-04-27 DIAGNOSIS — G8929 Other chronic pain: Secondary | ICD-10-CM | POA: Insufficient documentation

## 2015-04-27 MED ORDER — ACETAMINOPHEN 325 MG PO TABS
650.0000 mg | ORAL_TABLET | Freq: Once | ORAL | Status: AC
  Administered 2015-04-27: 650 mg via ORAL
  Filled 2015-04-27: qty 2

## 2015-04-27 MED ORDER — ONDANSETRON HCL 4 MG PO TABS: 4.0000 mg | ORAL_TABLET | Freq: Four times a day (QID) | ORAL | Status: AC

## 2015-04-27 MED ORDER — ONDANSETRON HCL 4 MG PO TABS
4.0000 mg | ORAL_TABLET | Freq: Once | ORAL | Status: AC
  Administered 2015-04-27: 4 mg via ORAL
  Filled 2015-04-27: qty 1
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
## 2015-04-27 NOTE — ED Notes (Signed)
Pt alert, oriented, and ambulatory upon DC. She was advised to follow up with neurology for further evaluation and she verbalizes understanding to do so.

## 2015-04-27 NOTE — Discharge Instructions (Signed)
°  Please monitor for worsening signs or symptoms, please follow-up with neurology for further evaluation of concussion. Please return to the emergency room as needed for worsening signs or symptoms.
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
## 2015-05-13 ENCOUNTER — Encounter: Payer: Self-pay | Admitting: Neurology

## 2015-05-13 ENCOUNTER — Ambulatory Visit (INDEPENDENT_AMBULATORY_CARE_PROVIDER_SITE_OTHER): Payer: Medicaid Other | Admitting: Neurology

## 2015-05-13 VITALS — BP 108/71 | HR 72 | Ht 66.0 in | Wt 122.0 lb

## 2015-05-13 DIAGNOSIS — R51 Headache: Secondary | ICD-10-CM | POA: Diagnosis not present

## 2015-05-13 DIAGNOSIS — R519 Headache, unspecified: Secondary | ICD-10-CM

## 2015-05-13 DIAGNOSIS — S0990XD Unspecified injury of head, subsequent encounter: Secondary | ICD-10-CM | POA: Diagnosis not present

## 2015-05-13 MED ORDER — NORTRIPTYLINE HCL 25 MG PO CAPS: ORAL_CAPSULE | ORAL | Status: AC

## 2015-05-13 MED ORDER — RIZATRIPTAN BENZOATE 5 MG PO TBDP: 5.0000 mg | ORAL_TABLET | ORAL | Status: AC | PRN
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
## 2015-05-13 NOTE — Addendum Note (Signed)
Addended by: Levert FeinsteinYAN, Kaysan Peixoto on: 05/13/2015 09:14 AM   Modules accepted: Level of Service

## 2015-05-22 ENCOUNTER — Encounter (INDEPENDENT_AMBULATORY_CARE_PROVIDER_SITE_OTHER): Payer: Medicaid Other | Admitting: Diagnostic Neuroimaging

## 2015-05-22 ENCOUNTER — Ambulatory Visit
Admission: RE | Admit: 2015-05-22 | Discharge: 2015-05-22 | Disposition: A | Discharge status: Home / Self Care | Payer: Medicaid Other | Source: Ambulatory Visit | Attending: Neurology | Admitting: Neurology

## 2015-05-22 DIAGNOSIS — R51 Headache: Secondary | ICD-10-CM | POA: Diagnosis not present

## 2015-05-22 DIAGNOSIS — R519 Headache, unspecified: Secondary | ICD-10-CM

## 2015-05-22 DIAGNOSIS — S0990XD Unspecified injury of head, subsequent encounter: Secondary | ICD-10-CM

## 2015-05-23 ENCOUNTER — Telehealth: Payer: Self-pay | Admitting: *Deleted

## 2015-05-23 NOTE — Telephone Encounter (Signed)
Patient and mother aware of MRI results.

## 2015-05-23 NOTE — Telephone Encounter (Signed)
-----   Message from Levert FeinsteinYijun Yan, MD sent at 05/23/2015  9:46 AM EDT ----- Please call patient, there was no significant abnormalities on MRI brain

## 2015-06-03 ENCOUNTER — Ambulatory Visit (INDEPENDENT_AMBULATORY_CARE_PROVIDER_SITE_OTHER): Payer: Self-pay | Admitting: Neurology

## 2015-06-03 DIAGNOSIS — Z0289 Encounter for other administrative examinations: Secondary | ICD-10-CM

## 2015-06-03 NOTE — Progress Notes (Signed)
error 

## 2015-06-04 ENCOUNTER — Encounter: Payer: Self-pay | Admitting: Neurology
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
## 2019-01-05 ENCOUNTER — Encounter (HOSPITAL_COMMUNITY): Payer: Self-pay

## 2019-01-05 ENCOUNTER — Emergency Department (HOSPITAL_COMMUNITY)
Admission: EM | Admit: 2019-01-05 | Discharge: 2019-01-06 | Disposition: A | Discharge status: Home / Self Care | Payer: Medicaid Other | Attending: Emergency Medicine | Admitting: Emergency Medicine

## 2019-01-05 DIAGNOSIS — R112 Nausea with vomiting, unspecified: Secondary | ICD-10-CM | POA: Insufficient documentation

## 2019-01-05 DIAGNOSIS — Z5321 Procedure and treatment not carried out due to patient leaving prior to being seen by health care provider: Secondary | ICD-10-CM | POA: Diagnosis not present

## 2019-01-05 LAB — COMPREHENSIVE METABOLIC PANEL
ALT: 19 U/L (ref 0–44)
ANION GAP: 8 (ref 5–15)
AST: 19 U/L (ref 15–41)
Albumin: 3.7 g/dL (ref 3.5–5.0)
Alkaline Phosphatase: 42 U/L (ref 38–126)
BUN: 13 mg/dL (ref 6–20)
CALCIUM: 8.9 mg/dL (ref 8.9–10.3)
CHLORIDE: 110 mmol/L (ref 98–111)
CO2: 22 mmol/L (ref 22–32)
Creatinine, Ser: 0.92 mg/dL (ref 0.44–1.00)
GFR calc non Af Amer: >60 mL/min (ref >60)
Glucose, Bld: 109 mg/dL — ABNORMAL HIGH (ref 70–99)
Potassium: 4.4 mmol/L (ref 3.5–5.1)
Sodium: 140 mmol/L (ref 135–145)
Total Bilirubin: 0.4 mg/dL (ref 0.3–1.2)
Total Protein: 6.8 g/dL (ref 6.5–8.1)

## 2019-01-05 LAB — CBC
HEMATOCRIT: 39.7 % (ref 36.0–46.0)
Hemoglobin: 12.6 g/dL (ref 12.0–15.0)
MCH: 30.5 pg (ref 26.0–34.0)
MCHC: 31.7 g/dL (ref 30.0–36.0)
MCV: 96.1 fL (ref 80.0–100.0)
NRBC: 0 % (ref 0.0–0.2)
PLATELETS: 261 10*3/uL (ref 150–400)
RBC: 4.13 MIL/uL (ref 3.87–5.11)
RDW: 13.3 % (ref 11.5–15.5)
WBC: 10.1 10*3/uL (ref 4.0–10.5)

## 2019-01-05 LAB — URINALYSIS, ROUTINE W REFLEX MICROSCOPIC
Bilirubin Urine: NEGATIVE
Glucose, UA: NEGATIVE mg/dL
Hgb urine dipstick: NEGATIVE
Ketones, ur: NEGATIVE mg/dL
Leukocytes, UA: NEGATIVE
Nitrite: NEGATIVE
PROTEIN: NEGATIVE mg/dL
Specific Gravity, Urine: 1.017 (ref 1.005–1.030)
pH: 6 (ref 5.0–8.0)

## 2019-01-05 LAB — I-STAT BETA HCG BLOOD, ED (MC, WL, AP ONLY): I-stat hCG, quantitative: <5 m[IU]/mL (ref <5)

## 2019-01-05 LAB — LIPASE, BLOOD: LIPASE: 30 U/L (ref 11–51)

## 2019-01-05 NOTE — ED Triage Notes (Signed)
Pt reports n.v and abd pain for 2 weeks, states she is unable to keep anything down. Took preg test at home that were negative

## 2019-01-06 NOTE — ED Notes (Signed)
No answer in the waiting room at this time.
